# Patient Record
Sex: Female | Born: 1990
Health system: Southern US, Community
[De-identification: ages and names within clinical notes are randomized; demographics above are authoritative.]

## PROBLEM LIST (undated history)

## (undated) DIAGNOSIS — F419 Anxiety disorder, unspecified: Secondary | ICD-10-CM

## (undated) DIAGNOSIS — F909 Attention-deficit hyperactivity disorder, unspecified type: Secondary | ICD-10-CM

---
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
## 2014-11-28 ENCOUNTER — Encounter (HOSPITAL_COMMUNITY): Payer: Self-pay | Admitting: Emergency Medicine

## 2014-11-28 ENCOUNTER — Emergency Department (HOSPITAL_COMMUNITY)
Admission: EM | Admit: 2014-11-28 | Discharge: 2014-11-28 | Disposition: A | Discharge status: Home / Self Care | Payer: BLUE CROSS/BLUE SHIELD | Attending: Emergency Medicine | Admitting: Emergency Medicine

## 2014-11-28 DIAGNOSIS — F909 Attention-deficit hyperactivity disorder, unspecified type: Secondary | ICD-10-CM | POA: Insufficient documentation

## 2014-11-28 DIAGNOSIS — R0600 Dyspnea, unspecified: Secondary | ICD-10-CM | POA: Diagnosis not present

## 2014-11-28 DIAGNOSIS — Z7982 Long term (current) use of aspirin: Secondary | ICD-10-CM | POA: Insufficient documentation

## 2014-11-28 DIAGNOSIS — Z79899 Other long term (current) drug therapy: Secondary | ICD-10-CM | POA: Diagnosis not present

## 2014-11-28 DIAGNOSIS — R200 Anesthesia of skin: Secondary | ICD-10-CM | POA: Diagnosis not present

## 2014-11-28 DIAGNOSIS — R202 Paresthesia of skin: Secondary | ICD-10-CM | POA: Diagnosis present

## 2014-11-28 DIAGNOSIS — Z792 Long term (current) use of antibiotics: Secondary | ICD-10-CM | POA: Insufficient documentation

## 2014-11-28 HISTORY — DX: Attention-deficit hyperactivity disorder, unspecified type: F90.9

## 2014-11-28 NOTE — ED Provider Notes (Signed)
CSN: [id number]     Arrival date & time 11/28/14  2138 History   First MD Initiated Contact with Patient 11/28/14 2159     Chief Complaint  Patient presents with  . Tingling     (Consider location/radiation/quality/duration/timing/severity/associated sxs/prior Treatment) HPI Patient presents after an episode of dyspnea, tingling in all 4 extremities. Patient states that she has episodes of dyspnea. Today's was longer than usual, and ED and she developed numbness and tingling in all 4 distal extremities. There was mild focal chest tightness, but no pain in the extremity is, no loss of sensation or strength completely in any extremity. Patient was well prior to the onset of symptoms, and currently denies complaints. 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 She has history of episodic bronchitis, but none recently. precipitant, and symptoms resolved at rest.  Past Medical History  Diagnosis Date  . ADHD (attention deficit hyperactivity disorder)    History reviewed. No pertinent past surgical history. No family history on file. History  Substance Use Topics  . Smoking status: Never Smoker   . Smokeless tobacco: Not on file  . Alcohol Use: Yes   OB History    No data available     Review of Systems  Constitutional:       Per HPI, otherwise negative  HENT:       Per HPI, otherwise negative  Respiratory:       Per HPI, otherwise negative  Cardiovascular:       Per HPI, otherwise negative  Gastrointestinal: Negative for vomiting.  Endocrine:       Negative aside from HPI  Genitourinary:       Neg aside from HPI   Musculoskeletal:       Per HPI, otherwise negative  Skin: Negative.   Neurological: Negative for syncope.      Allergies  Lactose intolerance (gi)  Home Medications   Prior to Admission medications   Medication Sig Start Date End Date Taking?  Authorizing Provider  aspirin-acetaminophen-caffeine (EXCEDRIN MIGRAINE) [phone] MG per tablet Take 1 tablet by mouth every 6 (six) hours as needed for headache.   Yes Historical Provider, MD  methylphenidate (RITALIN) 5 MG tablet Take 1 tablet by mouth daily as needed. Afternoon for homework 11/04/14  Yes Historical Provider, MD  methylphenidate 54 MG PO CR tablet Take 1 tablet by mouth every morning. 11/04/14  Yes Historical Provider, MD  norethindrone-ethinyl estradiol (JUNEL FE,GILDESS FE,LOESTRIN FE) 1-20 MG-MCG tablet Take 1 tablet by mouth daily.   Yes Historical Provider, MD   BP 118/85 mmHg  Pulse 88  Temp(Src) 97.7 F (36.5 C) (Oral)  Resp 16  SpO2 100%  LMP 11/21/2014 Physical Exam  Constitutional: She is oriented to person, place, and time. She appears well-developed and well-nourished. No distress.  HENT:  Head: Normocephalic and atraumatic.  Eyes: Conjunctivae and EOM are normal.  Cardiovascular: Normal rate and regular rhythm.   Pulmonary/Chest: Effort normal and breath sounds normal. No stridor. No respiratory distress.  Abdominal: She exhibits no distension.  Musculoskeletal: She exhibits no edema.  Neurological: She is alert and oriented to person, place, and time. She displays no atrophy and no tremor. No cranial nerve deficit or sensory deficit. She exhibits normal muscle tone. She displays no seizure activity. Coordination normal.  Skin: Skin is warm and dry.  Psychiatric: She has a normal mood and affect.  Nursing note and vitals reviewed.   ED  Course  Procedures (including critical care time)   MDM   Final diagnoses:  Numbness  Dyspnea   well-appearing young female presents after an episode dyspnea, with eventual tingling in all 4 extremities. Resolution of symptoms, the patient's absence of medical issues, and reassuring physical exam also suggests no acute new pathology, with low suspicion for occult infection. However, given the patient's episode was  unusual for her, we discussed at length, careful return precautions, follow-up instructions.   Gerhard Munchobert Jahmai Finelli, MD 11/28/14 2228

## 2014-11-28 NOTE — ED Notes (Signed)
Pt c/o SOB at 5 pm, lasting about two hours. Pt denies SOB at this time but c/o tingling in hands and feet. Pt denies being anxious at this time. Pt sts she had classes all day and had just gotten to work when she started to feel SOB. Pt speaking in complete sentences at this time. NAD noted. A&Ox4. Ambulatory. Pt tearful in triage and sts "I hate hospitals." Denies chest pain, dizziness, lightheadedness, at this time.

## 2014-11-28 NOTE — Discharge Instructions (Signed)
As discussed, your evaluation today has been largely reassuring.  But, it is important that you monitor your condition carefully, and do not hesitate to return to the ED if you develop new, or concerning changes in your condition. ? ?Otherwise, please follow-up with your physician for appropriate ongoing care. ? ?

## 2015-08-05 ENCOUNTER — Other Ambulatory Visit: Payer: Self-pay | Admitting: Family Medicine

## 2015-08-05 ENCOUNTER — Other Ambulatory Visit (HOSPITAL_COMMUNITY)
Admission: RE | Admit: 2015-08-05 | Discharge: 2015-08-05 | Disposition: A | Discharge status: Home / Self Care | Payer: BLUE CROSS/BLUE SHIELD | Source: Ambulatory Visit | Attending: Family Medicine | Admitting: Family Medicine

## 2015-08-05 ENCOUNTER — Inpatient Hospital Stay (HOSPITAL_COMMUNITY): Admit: 2015-08-05 | Payer: Self-pay

## 2015-08-05 DIAGNOSIS — Z01411 Encounter for gynecological examination (general) (routine) with abnormal findings: Secondary | ICD-10-CM | POA: Diagnosis present

## 2015-08-07 LAB — CYTOLOGY - PAP

## 2017-05-20 ENCOUNTER — Emergency Department (HOSPITAL_BASED_OUTPATIENT_CLINIC_OR_DEPARTMENT_OTHER): Payer: BLUE CROSS/BLUE SHIELD

## 2017-05-20 ENCOUNTER — Emergency Department (HOSPITAL_BASED_OUTPATIENT_CLINIC_OR_DEPARTMENT_OTHER)
Admission: EM | Admit: 2017-05-20 | Discharge: 2017-05-20 | Disposition: A | Discharge status: Home / Self Care | Payer: BLUE CROSS/BLUE SHIELD | Attending: Emergency Medicine | Admitting: Emergency Medicine

## 2017-05-20 ENCOUNTER — Encounter (HOSPITAL_BASED_OUTPATIENT_CLINIC_OR_DEPARTMENT_OTHER): Payer: Self-pay | Admitting: *Deleted

## 2017-05-20 DIAGNOSIS — N132 Hydronephrosis with renal and ureteral calculous obstruction: Secondary | ICD-10-CM | POA: Diagnosis not present

## 2017-05-20 DIAGNOSIS — Z79899 Other long term (current) drug therapy: Secondary | ICD-10-CM | POA: Insufficient documentation

## 2017-05-20 DIAGNOSIS — R1031 Right lower quadrant pain: Secondary | ICD-10-CM | POA: Diagnosis present

## 2017-05-20 DIAGNOSIS — Z793 Long term (current) use of hormonal contraceptives: Secondary | ICD-10-CM | POA: Diagnosis not present

## 2017-05-20 DIAGNOSIS — N201 Calculus of ureter: Secondary | ICD-10-CM

## 2017-05-20 HISTORY — DX: Anxiety disorder, unspecified: F41.9

## 2017-05-20 HISTORY — DX: Attention-deficit hyperactivity disorder, unspecified type: F90.9

## 2017-05-20 LAB — CBC WITH DIFFERENTIAL/PLATELET
Basophils Absolute: 0 10*3/uL (ref 0.0–0.1)
Basophils Relative: 0 %
EOS ABS: 0.2 10*3/uL (ref 0.0–0.7)
EOS PCT: 3 %
HCT: 41.1 % (ref 36.0–46.0)
HEMOGLOBIN: 14.3 g/dL (ref 12.0–15.0)
LYMPHS ABS: 2.1 10*3/uL (ref 0.7–4.0)
LYMPHS PCT: 23 %
MCH: 29.8 pg (ref 26.0–34.0)
MCHC: 34.8 g/dL (ref 30.0–36.0)
MCV: 85.6 fL (ref 78.0–100.0)
Monocytes Absolute: 0.6 10*3/uL (ref 0.1–1.0)
Monocytes Relative: 7 %
NEUTROS PCT: 67 %
Neutro Abs: 6.4 10*3/uL (ref 1.7–7.7)
Platelets: 241 10*3/uL (ref 150–400)
RBC: 4.8 MIL/uL (ref 3.87–5.11)
RDW: 12.7 % (ref 11.5–15.5)
WBC: 9.4 10*3/uL (ref 4.0–10.5)

## 2017-05-20 LAB — URINALYSIS, ROUTINE W REFLEX MICROSCOPIC
Glucose, UA: NEGATIVE mg/dL
KETONES UR: NEGATIVE mg/dL
LEUKOCYTES UA: NEGATIVE
NITRITE: NEGATIVE
PROTEIN: 30 mg/dL — AB
Specific Gravity, Urine: >1.03 — ABNORMAL HIGH (ref 1.005–1.030)
pH: 6 (ref 5.0–8.0)

## 2017-05-20 LAB — URINALYSIS, MICROSCOPIC (REFLEX)

## 2017-05-20 LAB — BASIC METABOLIC PANEL
Anion gap: 8 (ref 5–15)
BUN: 12 mg/dL (ref 6–20)
CHLORIDE: 105 mmol/L (ref 101–111)
CO2: 25 mmol/L (ref 22–32)
CREATININE: 0.88 mg/dL (ref 0.44–1.00)
Calcium: 9.3 mg/dL (ref 8.9–10.3)
GFR calc Af Amer: >60 mL/min (ref >60)
GFR calc non Af Amer: >60 mL/min (ref >60)
Glucose, Bld: 99 mg/dL (ref 65–99)
POTASSIUM: 3.8 mmol/L (ref 3.5–5.1)
Sodium: 138 mmol/L (ref 135–145)

## 2017-05-20 LAB — PREGNANCY, URINE: PREG TEST UR: NEGATIVE

## 2017-05-20 LAB — WET PREP, GENITAL
Sperm: NONE SEEN
Trich, Wet Prep: NONE SEEN
YEAST WET PREP: NONE SEEN

## 2017-05-20 MED ORDER — IBUPROFEN 600 MG PO TABS: 600.0000 mg | ORAL_TABLET | Freq: Three times a day (TID) | ORAL | 0 refills | Status: AC | PRN

## 2017-05-20 MED ORDER — HYDROCODONE-ACETAMINOPHEN 5-325 MG PO TABS: 1.0000 | ORAL_TABLET | ORAL | 0 refills | Status: AC | PRN

## 2017-05-20 MED ORDER — KETOROLAC TROMETHAMINE 30 MG/ML IJ SOLN
15.0000 mg | Freq: Once | INTRAMUSCULAR | Status: AC
  Administered 2017-05-20: 15 mg via INTRAVENOUS
  Filled 2017-05-20: qty 1

## 2017-05-20 MED ORDER — IOPAMIDOL (ISOVUE-300) INJECTION 61%
100.0000 mL | Freq: Once | INTRAVENOUS | Status: AC | PRN
  Administered 2017-05-20: 100 mL via INTRAVENOUS

## 2017-05-20 MED ORDER — MORPHINE SULFATE (PF) 4 MG/ML IV SOLN
4.0000 mg | Freq: Once | INTRAVENOUS | Status: AC
  Administered 2017-05-20: 4 mg via INTRAVENOUS
  Filled 2017-05-20: qty 1

## 2017-05-20 NOTE — ED Notes (Signed)
Patient transported to Ultrasound 

## 2017-05-20 NOTE — ED Triage Notes (Signed)
Pt c/o right abd pain x 1 day

## 2017-05-20 NOTE — ED Notes (Signed)
Patient returned from CT

## 2017-05-20 NOTE — ED Notes (Signed)
Patient transported to CT 

## 2017-05-20 NOTE — ED Provider Notes (Addendum)
[facility]   CSN: [id number] Arrival date & time: 05/20/17  1837     History   Chief Complaint Chief Complaint  Patient presents with  . Abdominal Pain    HPI Rachael Lopez is a 26 y.o. female.  HPI  26 year old female presents with right lower quadrant pain. She rates it as severe. Is both sharp and cramping. She states she had vaginal bleeding last week but is not expecting her menstrual cycle until this upcoming week. Not currently bleeding. No dysuria or hematuria. No nausea or vomiting. She felt constipated earlier and took a laxative and has had some loose stools since. She denies any back pain. No fevers. Took Midol for pain yesterday. She states that this feels somewhat like when she would have menstrual pain except that usually it is bilateral and not this severe.  Past Medical History:  Diagnosis Date  . ADHD   . Anxiety     There are no active problems to display for this patient.   History reviewed. No pertinent surgical history.  OB History    No data available       Home Medications    Prior to Admission medications   Medication Sig Start Date End Date Taking? Authorizing Provider  lisdexamfetamine (VYVANSE) 40 MG capsule Take 40 mg by mouth every morning.   Yes [provider]  norethindrone-ethinyl estradiol (JUNEL FE,GILDESS FE,LOESTRIN FE) 1-20 MG-MCG tablet Take 1 tablet by mouth daily.   Yes [provider]  sertraline (ZOLOFT) 100 MG tablet Take 100 mg by mouth daily.   Yes [provider]  HYDROcodone-acetaminophen (NORCO) 5-325 MG tablet Take 1 tablet by mouth every 4 (four) hours as needed for severe pain. 05/20/17   Pricilla Loveless, MD  ibuprofen (ADVIL,MOTRIN) 600 MG tablet Take 1 tablet (600 mg total) by mouth every 8 (eight) hours as needed. 05/20/17   Pricilla Loveless, MD    Family History No family history on file.  Social History Social History  Substance Use Topics  .  Smoking status: Never Smoker  . Smokeless tobacco: Not on file  . Alcohol use No     Allergies   Patient has no known allergies.   Review of Systems Review of Systems  Constitutional: Negative for fever.  Respiratory: Negative for shortness of breath.   Cardiovascular: Negative for chest pain.  Gastrointestinal: Positive for abdominal pain. Negative for nausea and vomiting.  Genitourinary: Negative for dysuria, hematuria, vaginal bleeding and vaginal discharge.  Musculoskeletal: Negative for back pain.  All other systems reviewed and are negative.    Physical Exam Updated Vital Signs BP 99/82   Pulse 63   Temp 98.7 F (37.1 C) (Oral)   Resp 16   Ht  (1.6 m)   Wt 70.3 kg (155 lb)   LMP 05/12/2017   SpO2 99%   BMI 27.46 kg/m   Physical Exam  Constitutional: She is oriented to person, place, and time. She appears well-developed and well-nourished.  HENT:  Head: Normocephalic and atraumatic.  Right Ear: External ear normal.  Left Ear: External ear normal.  Nose: Nose normal.  Eyes: Right eye exhibits no discharge. Left eye exhibits no discharge.  Cardiovascular: Normal rate, regular rhythm and normal heart sounds.   Pulmonary/Chest: Effort normal and breath sounds normal.  Abdominal: Soft. There is tenderness in the right lower quadrant.    Genitourinary: Uterus is tender. Right adnexum displays tenderness. Right adnexum displays no mass. Left adnexum displays no mass.  No vaginal discharge found.  Genitourinary Comments: Circular dark redness around middle of cervix  Neurological: She is alert and oriented to person, place, and time.  Skin: Skin is warm and dry. She is not diaphoretic.  Nursing note and vitals reviewed.    ED Treatments / Results  Labs (all labs ordered are listed, but only abnormal results are displayed) Labs Reviewed  WET PREP, GENITAL - Abnormal; Notable for the following:       Result Value   Clue Cells Wet Prep HPF POC PRESENT (*)     WBC, Wet Prep HPF POC MODERATE (*)    All other components within normal limits  URINALYSIS, ROUTINE W REFLEX MICROSCOPIC - Abnormal; Notable for the following:    APPearance CLOUDY (*)    Specific Gravity, Urine >1.030 (*)    Hgb urine dipstick LARGE (*)    Bilirubin Urine SMALL (*)    Protein, ur 30 (*)    All other components within normal limits  URINALYSIS, MICROSCOPIC (REFLEX) - Abnormal; Notable for the following:    Bacteria, UA FEW (*)    Squamous Epithelial / LPF 6-30 (*)    All other components within normal limits  PREGNANCY, URINE  BASIC METABOLIC PANEL  CBC WITH DIFFERENTIAL/PLATELET  GC/CHLAMYDIA PROBE AMP (Interior) NOT AT [facility]    EKG  EKG Interpretation None       Radiology US Transvaginal Non-ob  Result Date: 05/20/2017 CLINICAL DATA:  C/o RLQ pain today. States her menses came a week early this month which is unusual as she takes BCP's that give her one menses every 3-4 months and she takes the pill "religiously" EXAM: TRANSABDOMINAL AND TRANSVAGINAL ULTRASOUND OF PELVIS DOPPLER ULTRASOUND OF OVARIES TECHNIQUE: Both transabdominal and transvaginal ultrasound examinations of the pelvis were performed. Transabdominal technique was performed for global imaging of the pelvis including uterus, ovaries, adnexal regions, and pelvic cul-de-sac. It was necessary to proceed with endovaginal exam following the transabdominal exam to visualize the ovaries. Color and duplex Doppler ultrasound was utilized to evaluate blood flow to the ovaries. COMPARISON:  None. FINDINGS: Uterus Measurements: 6.4 x 2.4 x 4.0 cm. No fibroids or other mass visualized. Endometrium Thickness: 2 mm. No focal abnormality. Small amount of fluid in the canal. Right ovary Measurements: 2.6 x 1.5 x 2.2 cm. Normal appearance/no adnexal mass. Left ovary Measurements: 2.5 x 1.5 x 1.7 cm. Normal appearance/no adnexal mass. Pulsed Doppler evaluation of both ovaries demonstrates normal low-resistance  arterial and venous waveforms. Other findings Trace free pelvic fluid. IMPRESSION: Normal uterus and ovaries. Electronically Signed   By: Ellery Plunk M.D.   On: 05/20/2017 21:33   US Pelvis Complete  Result Date: 05/20/2017 CLINICAL DATA:  C/o RLQ pain today. States her menses came a week early this month which is unusual as she takes BCP's that give her one menses every 3-4 months and she takes the pill "religiously" EXAM: TRANSABDOMINAL AND TRANSVAGINAL ULTRASOUND OF PELVIS DOPPLER ULTRASOUND OF OVARIES TECHNIQUE: Both transabdominal and transvaginal ultrasound examinations of the pelvis were performed. Transabdominal technique was performed for global imaging of the pelvis including uterus, ovaries, adnexal regions, and pelvic cul-de-sac. It was necessary to proceed with endovaginal exam following the transabdominal exam to visualize the ovaries. Color and duplex Doppler ultrasound was utilized to evaluate blood flow to the ovaries. COMPARISON:  None. FINDINGS: Uterus Measurements: 6.4 x 2.4 x 4.0 cm. No fibroids or other mass visualized. Endometrium Thickness: 2 mm. No focal abnormality. Small amount of fluid in the  canal. Right ovary Measurements: 2.6 x 1.5 x 2.2 cm. Normal appearance/no adnexal mass. Left ovary Measurements: 2.5 x 1.5 x 1.7 cm. Normal appearance/no adnexal mass. Pulsed Doppler evaluation of both ovaries demonstrates normal low-resistance arterial and venous waveforms. Other findings Trace free pelvic fluid. IMPRESSION: Normal uterus and ovaries. Electronically Signed   By: Ellery Plunk M.D.   On: 05/20/2017 21:33   Ct Abdomen Pelvis W Contrast  Result Date: 05/20/2017 CLINICAL DATA:  Pt c/o right abd pain x 1 day  No past med hx EXAM: CT ABDOMEN AND PELVIS WITH CONTRAST TECHNIQUE: Multidetector CT imaging of the abdomen and pelvis was performed using the standard protocol following bolus administration of intravenous contrast. CONTRAST:  ISOVUE-300 IOPAMIDOL  (ISOVUE-300) INJECTION 61% COMPARISON:  None. FINDINGS: Lower chest: No acute abnormality. Hepatobiliary: No focal liver abnormality is seen. No gallstones, gallbladder wall thickening, or biliary dilatation. Pancreas: Unremarkable. No pancreatic ductal dilatation or surrounding inflammatory changes. Spleen: Normal in size without focal abnormality. Adrenals/Urinary Tract: Both adrenals are normal. No significant renal parenchymal lesions. There is delayed function and moderate hydronephrosis on the right, with ureteral dilatation down to an obstructing 3 x 4 mm calculus of the distal ureter 4 cm proximal to the ureterovesical junction. No other urinary calculi are evident, but the presence of intravenous contrast lobe reduced sensitivity for detection of minute calculi. Left collecting system and ureter are unremarkable. Urinary bladder is unremarkable. Stomach/Bowel: Stomach is within normal limits. Appendix is normal. No evidence of bowel wall thickening, distention, or inflammatory changes. Vascular/Lymphatic: No significant vascular findings are present. No enlarged abdominal or pelvic lymph nodes. Reproductive: Uterus and bilateral adnexa are unremarkable. Other: No ascites.  Small fat containing umbilical hernia. Musculoskeletal: No significant skeletal lesion. IMPRESSION: 1. Normal appendix 2. 3 x 4 mm distal right ureteral calculus, 4 cm from the UVJ. Moderate hydronephrosis. Electronically Signed   By: Ellery Plunk M.D.   On: 05/20/2017 22:22   [place] Art/ven Flow Abd Pelv Doppler  Result Date: 05/20/2017 CLINICAL DATA:  C/o RLQ pain today. States her menses came a week early this month which is unusual as she takes BCP's that give her one menses every 3-4 months and she takes the pill "religiously" EXAM: TRANSABDOMINAL AND TRANSVAGINAL ULTRASOUND OF PELVIS DOPPLER ULTRASOUND OF OVARIES TECHNIQUE: Both transabdominal and transvaginal ultrasound examinations of the pelvis were performed.  Transabdominal technique was performed for global imaging of the pelvis including uterus, ovaries, adnexal regions, and pelvic cul-de-sac. It was necessary to proceed with endovaginal exam following the transabdominal exam to visualize the ovaries. Color and duplex Doppler ultrasound was utilized to evaluate blood flow to the ovaries. COMPARISON:  None. FINDINGS: Uterus Measurements: 6.4 x 2.4 x 4.0 cm. No fibroids or other mass visualized. Endometrium Thickness: 2 mm. No focal abnormality. Small amount of fluid in the canal. Right ovary Measurements: 2.6 x 1.5 x 2.2 cm. Normal appearance/no adnexal mass. Left ovary Measurements: 2.5 x 1.5 x 1.7 cm. Normal appearance/no adnexal mass. Pulsed Doppler evaluation of both ovaries demonstrates normal low-resistance arterial and venous waveforms. Other findings Trace free pelvic fluid. IMPRESSION: Normal uterus and ovaries. Electronically Signed   By: Ellery Plunk M.D.   On: 05/20/2017 21:33    Procedures Procedures (including critical care time)  Medications Ordered in ED Medications  morphine 4 MG/ML injection 4 mg (4 mg Intravenous Given 05/20/17 1956)  ketorolac (TORADOL) 30 MG/ML injection 15 mg (15 mg Intravenous Given 05/20/17 1956)  iopamidol (ISOVUE-300) 61 % injection 100 mL (  100 mLs Intravenous Contrast Given 05/20/17 2159)     Initial Impression / Assessment and Plan / ED Course  I have reviewed the triage vital signs and the nursing notes.  Pertinent labs & imaging results that were available during my care of the patient were reviewed by me and considered in my medical decision making (see chart for details).     Patient's pain is significantly better after a dose of morphine and Toradol. Extensive workup shows no evidence of ovarian torsion or appendicitis and does end up showing a distal right ureteral stone. This should be passable. She was given a urinary strainer, NSAIDs, and a short course of hydrocodone for severe breakthrough  pain. She has no signs or symptoms of an infected stone and she was counseled on signs to look for. Follow-up with urology. She was told to follow up with GYN for the abnormal appearance of her cervix. Discussed return precautions.  Final Clinical Impressions(s) / ED Diagnoses   Final diagnoses:  Right ureteral stone    New Prescriptions Discharge Medication List as of 05/20/2017 10:46 PM    START taking these medications   Details  HYDROcodone-acetaminophen (NORCO) 5-325 MG tablet Take 1 tablet by mouth every 4 (four) hours as needed for severe pain., Starting Fri 05/20/2017, Print    ibuprofen (ADVIL,MOTRIN) 600 MG tablet Take 1 tablet (600 mg total) by mouth every 8 (eight) hours as needed., Starting Fri 05/20/2017, Print         Pricilla Loveless, MD 05/20/17 2308    Pricilla Loveless, MD 05/20/17 2308

## 2017-05-21 ENCOUNTER — Encounter (HOSPITAL_COMMUNITY): Payer: Self-pay | Admitting: Emergency Medicine

## 2017-05-23 LAB — GC/CHLAMYDIA PROBE AMP (~~LOC~~) NOT AT ARMC
Chlamydia: NEGATIVE
Neisseria Gonorrhea: NEGATIVE

## 2018-06-20 IMAGING — CT CT ABD-PELV W/ CM
2 of 4 series · 16 of 46 positions shown, 18 images · IV contrast (APPLIED)
Comparison: None.

CLINICAL DATA: Pt c/o right abd pain x 1 day  No past med hx

EXAM:
CT ABDOMEN AND PELVIS WITH CONTRAST
TECHNIQUE: Multidetector CT imaging of the abdomen and pelvis was performed
using the standard protocol following bolus administration of
intravenous contrast.
CONTRAST:  100mL RRBTXW-2OO IOPAMIDOL (RRBTXW-2OO) INJECTION 61%

[Series 2: axial st · axial · 0.64mm/px · z∈[-660,-265]mm · 13 of 87 slices shown, 15 images]
[im 4/87  soft-tissue]
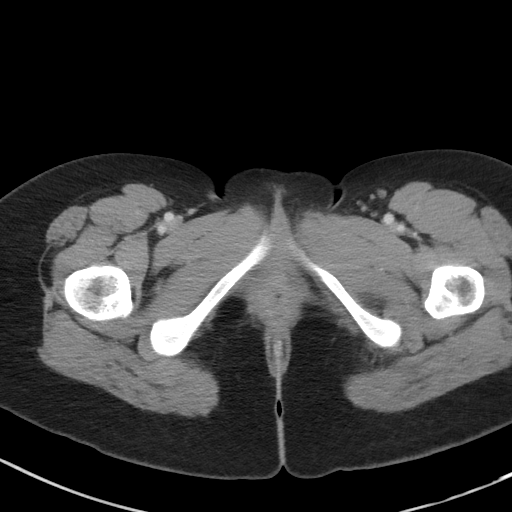
[im 4/87  bone]
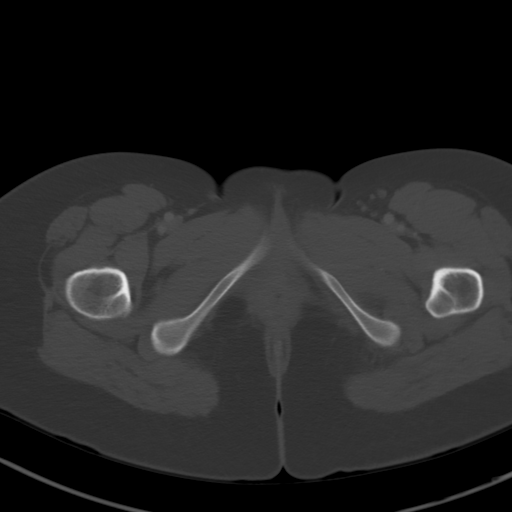
[im 11/87  soft-tissue]
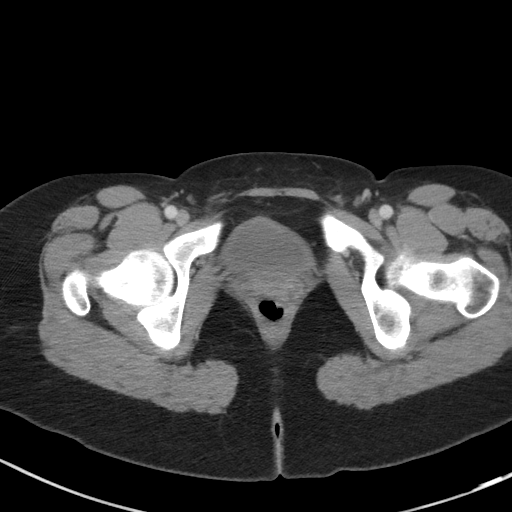
[im 18/87  soft-tissue]
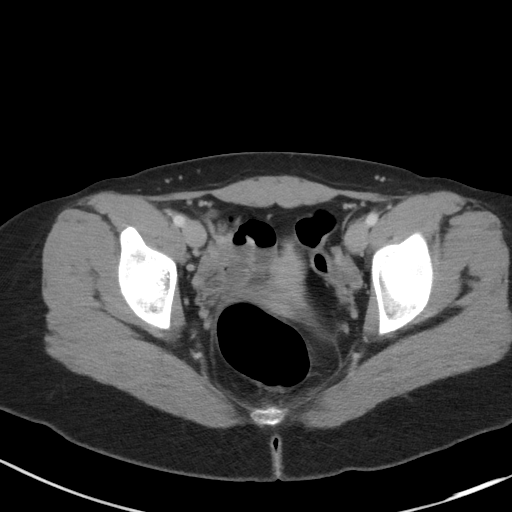
[im 25/87  soft-tissue]
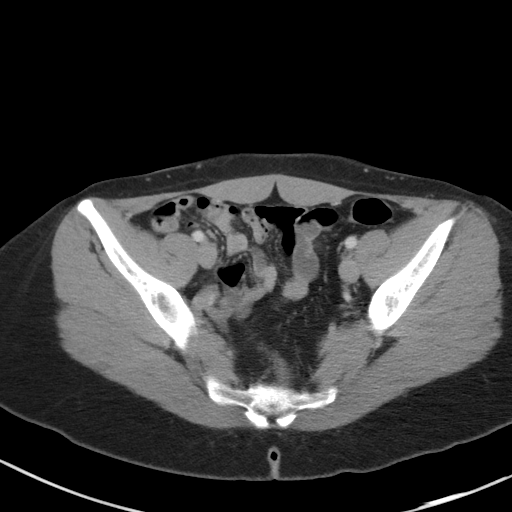
[im 31/87  soft-tissue]
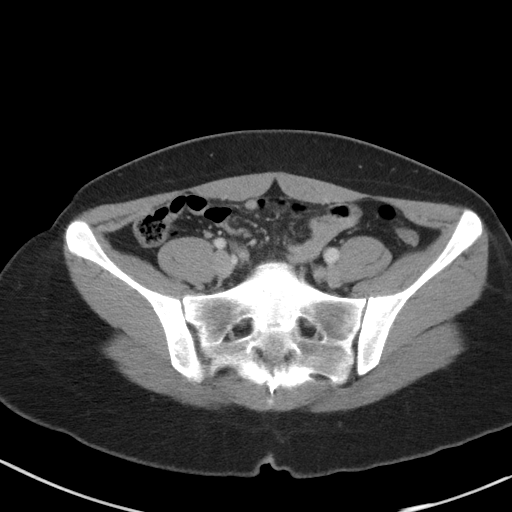
[im 38/87  soft-tissue]
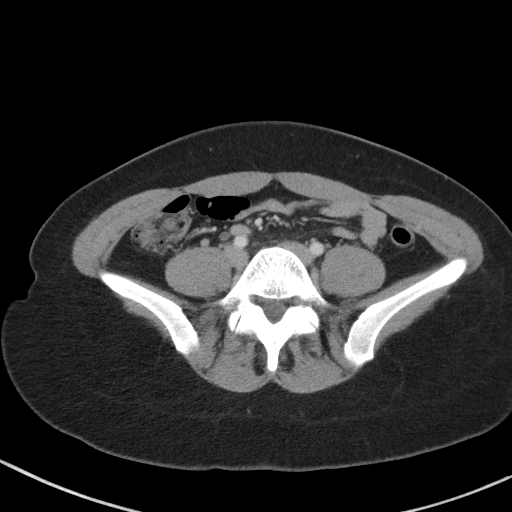
[im 45/87  soft-tissue]
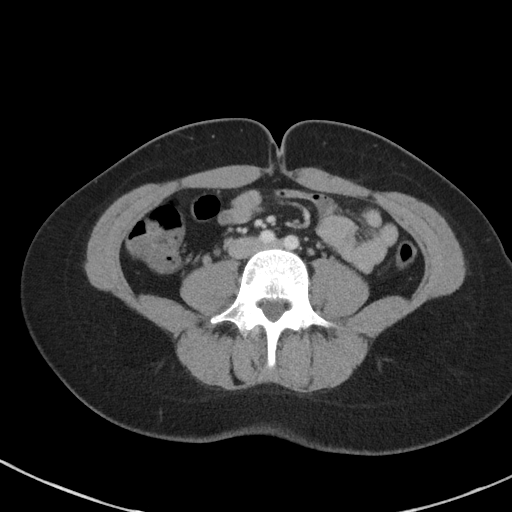
[im 49/87  soft-tissue]
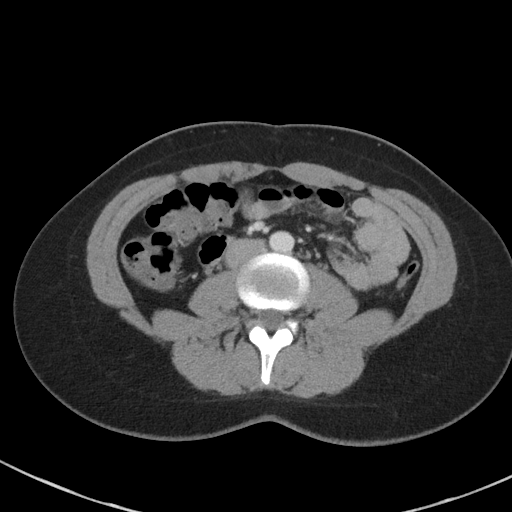
[im 56/87  soft-tissue]
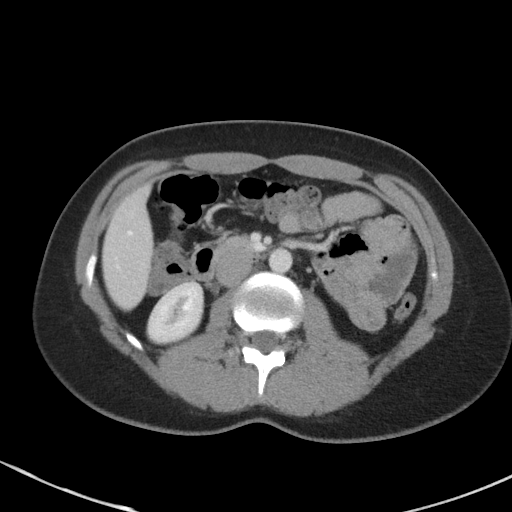
[im 56/87  bone]
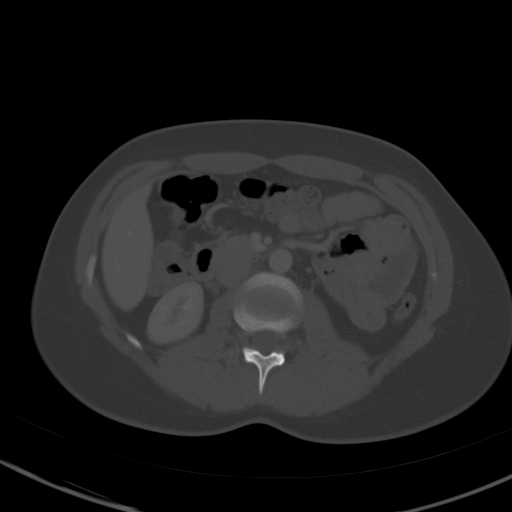
[im 62/87  soft-tissue]
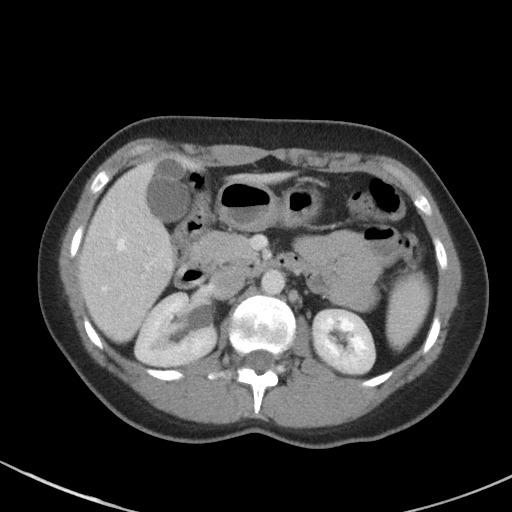
[im 69/87  soft-tissue]
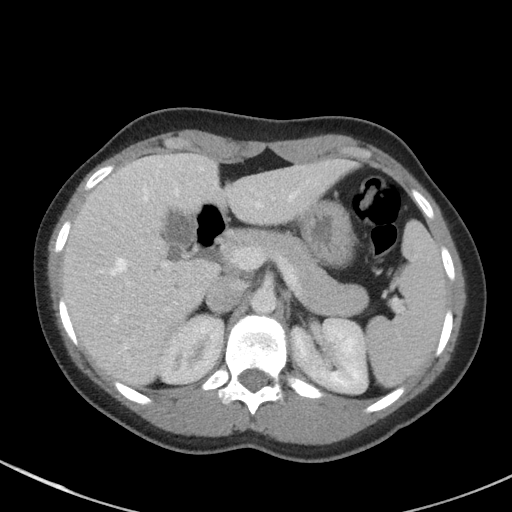
[im 76/87  soft-tissue]
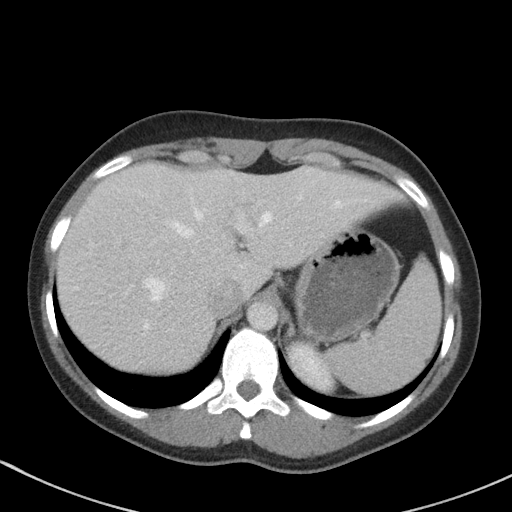
[im 83/87  soft-tissue]
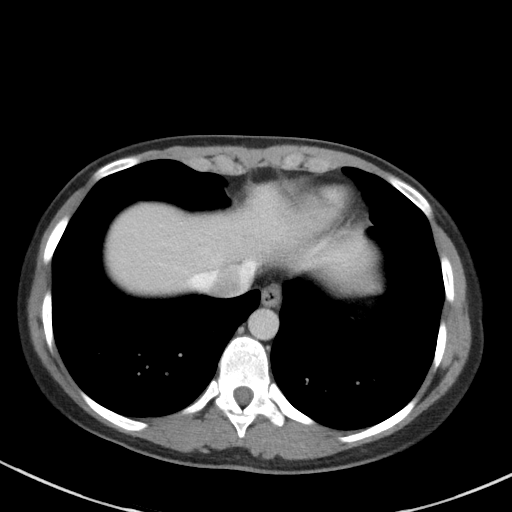

[Series 5: coronal st · coronal · 0.65mm/px · 3 of 86 slices shown]
[im 29/86  soft-tissue]
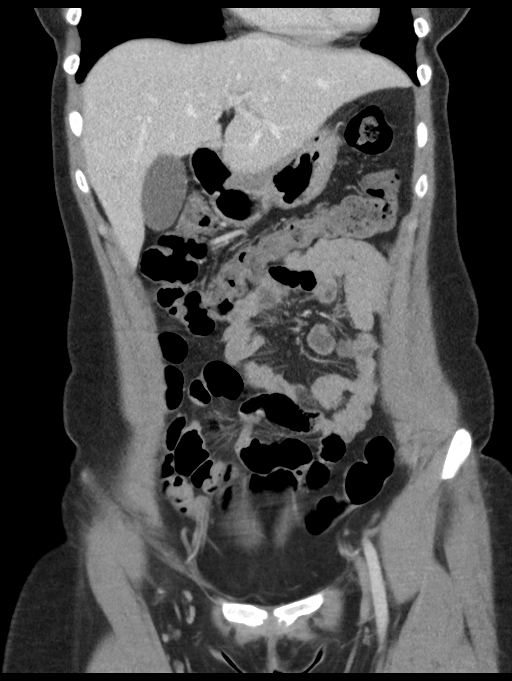
[im 38/86  soft-tissue]
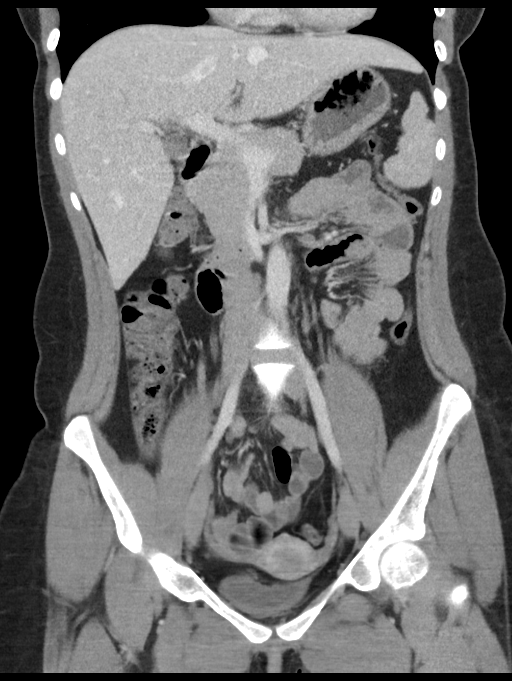
[im 48/86  soft-tissue]
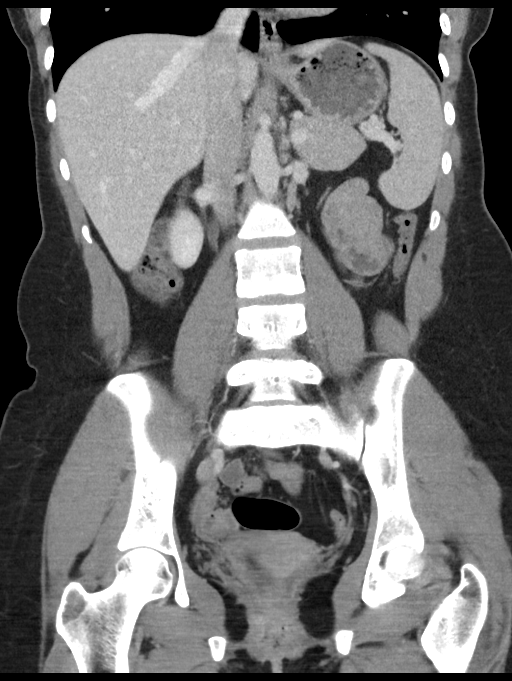

[16 of 46 positions shown; findings below may reference images not displayed]

FINDINGS: Lower chest: No acute abnormality.

Hepatobiliary: No focal liver abnormality is seen. No gallstones,
gallbladder wall thickening, or biliary dilatation.

Pancreas: Unremarkable. No pancreatic ductal dilatation or
surrounding inflammatory changes.

Spleen: Normal in size without focal abnormality.

Adrenals/Urinary Tract: Both adrenals are normal. No significant
renal parenchymal lesions.

There is delayed function and moderate hydronephrosis on the right,
with ureteral dilatation down to an obstructing 3 x 4 mm calculus of
the distal ureter 4 cm proximal to the ureterovesical junction. No
other urinary calculi are evident, but the presence of intravenous
contrast lobe reduced sensitivity for detection of minute calculi.
Left collecting system and ureter are unremarkable. Urinary bladder
is unremarkable.

Stomach/Bowel: Stomach is within normal limits. Appendix is normal.
No evidence of bowel wall thickening, distention, or inflammatory
changes.

Vascular/Lymphatic: No significant vascular findings are present. No
enlarged abdominal or pelvic lymph nodes.

Reproductive: Uterus and bilateral adnexa are unremarkable.

Other: No ascites.  Small fat containing umbilical hernia.

Musculoskeletal: No significant skeletal lesion.
IMPRESSION: 1. Normal appendix
2. 3 x 4 mm distal right ureteral calculus, 4 cm from the UVJ.
Moderate hydronephrosis.

## 2019-03-01 ENCOUNTER — Other Ambulatory Visit: Payer: Self-pay | Admitting: Nurse Practitioner

## 2019-03-01 ENCOUNTER — Other Ambulatory Visit (HOSPITAL_COMMUNITY)
Admission: RE | Admit: 2019-03-01 | Discharge: 2019-03-01 | Disposition: A | Discharge status: Home / Self Care | Payer: BLUE CROSS/BLUE SHIELD | Source: Ambulatory Visit | Attending: Nurse Practitioner | Admitting: Nurse Practitioner

## 2019-03-01 DIAGNOSIS — Z01419 Encounter for gynecological examination (general) (routine) without abnormal findings: Secondary | ICD-10-CM | POA: Insufficient documentation

## 2019-03-02 LAB — CYTOLOGY - PAP: Diagnosis: NEGATIVE

## 2019-03-07 ENCOUNTER — Other Ambulatory Visit: Payer: Self-pay | Admitting: Nurse Practitioner
